# Patient Record
Sex: Male | Born: 1965 | Race: White | Hispanic: No | Marital: Married | Smoking: Current every day smoker
Health system: Southern US, Community
[De-identification: ages and names within clinical notes are randomized; demographics above are authoritative.]

## PROBLEM LIST (undated history)

## (undated) DIAGNOSIS — K219 Gastro-esophageal reflux disease without esophagitis: Secondary | ICD-10-CM

## (undated) DIAGNOSIS — J449 Chronic obstructive pulmonary disease, unspecified: Secondary | ICD-10-CM

## (undated) DIAGNOSIS — K602 Anal fissure, unspecified: Secondary | ICD-10-CM

## (undated) DIAGNOSIS — C801 Malignant (primary) neoplasm, unspecified: Secondary | ICD-10-CM

## (undated) HISTORY — DX: Gastro-esophageal reflux disease without esophagitis: K21.9

## (undated) HISTORY — DX: Anal fissure, unspecified: K60.2

## (undated) HISTORY — PX: CERVICAL DISC SURGERY: SHX588

## (undated) HISTORY — PX: APPENDECTOMY: SHX54

## (undated) HISTORY — PX: OTHER SURGICAL HISTORY: SHX169

## (undated) HISTORY — PX: PLEURAL SCARIFICATION: SHX748

---

## 2002-07-08 ENCOUNTER — Inpatient Hospital Stay (HOSPITAL_COMMUNITY): Admission: EM | Admit: 2002-07-08 | Discharge: 2002-07-10 | Payer: Self-pay | Admitting: Cardiology

## 2007-02-25 ENCOUNTER — Emergency Department (HOSPITAL_COMMUNITY): Admission: EM | Admit: 2007-02-25 | Discharge: 2007-02-25 | Payer: Self-pay | Admitting: Emergency Medicine

## 2009-08-26 ENCOUNTER — Encounter: Admission: RE | Admit: 2009-08-26 | Discharge: 2009-08-26 | Payer: Self-pay | Admitting: Occupational Medicine

## 2009-12-15 ENCOUNTER — Ambulatory Visit (HOSPITAL_COMMUNITY)
Admission: RE | Admit: 2009-12-15 | Discharge: 2009-12-15 | Payer: Self-pay | Source: Home / Self Care | Admitting: Orthopaedic Surgery

## 2010-03-26 DIAGNOSIS — C801 Malignant (primary) neoplasm, unspecified: Secondary | ICD-10-CM

## 2010-03-26 HISTORY — DX: Malignant (primary) neoplasm, unspecified: C80.1

## 2010-08-11 NOTE — Discharge Summary (Signed)
NAME:  Mark Cortez, Mark Cortez                     ACCOUNT NO.:  [id number]   MEDICAL RECORD NO.:  [id number]                   PATIENT TYPE:  INP   LOCATION:  2011                                 FACILITY:  [facility]   PHYSICIAN:  Olga Millers, M.D.                DATE OF BIRTH:  04/06/1965   DATE OF ADMISSION:  07/08/2002  DATE OF DISCHARGE:  07/10/2002                                 DISCHARGE SUMMARY   DISCHARGE DIAGNOSES:  1. Chest discomfort and dyspnea, etiology unclear.  2. Tobacco abuse.  3. History of pneumothorax x2.  4. 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  5. History of spine surgery at the level of C5-6.  6. Previous history of leukocytosis.  7. History of dizziness.     a. MRI of the brain and carotid ultrasound performed by Dr. Doreen Beam.   PROCEDURES:  Cardiac catheterization by Vida Roller, M.D., on 07/09/02:  Normal coronaries.  Normal left ventricle.  Ejection fraction 65%.  No  mitral regurgitation.   HOSPITAL COURSE:  Please see the dictated consultation by Learta Codding,  M.D., on 07/07/02 from [facility] for complete details.  Briefly, this 45 year old male with no known coronary disease and minimal  risk factors for coronary artery disease was transferred to [facility] on 07/08/02 for further workup of chest pain and dyspnea.  His  symptoms were concerning for cardiac ischemia.  At [facility] the patient  underwent cardiac catheterization by Dr. Vida Roller on 07/09/02.  Results  are noted above.  The patient tolerated the procedure well without any  immediate complications.  On the morning of 07/10/02 Dr. Andee Lineman saw the  patient and felt he was stable enough for discharge to home.  Dr. Andee Lineman  suggested scheduling PFTs as an outpatient in [place] with his primary care  physician.  Also, the evaluation for his dizziness is ongoing and this  should be followed up with his primary care physician in  [place].  As noted  above, he has a brain MRI and carotid ultrasounds pending.   LABORATORY DATA:  Total cholesterol 219, triglycerides 151, HDL cholesterol  39, LDL 150.  Sodium 140, potassium 3.9, chloride 106, CO2 28, glucose 115,  BUN 10, creatinine 1.1, calcium 8.9.  Labs done at [facility]:  [facility]  count 8200, hemoglobin 14, hematocrit 43, platelet count 284,000.   Chest x-ray not performed, the patient had a chest x-ray performed at the  beginning of the month with his primary care  physician.   DISCHARGE MEDICATIONS:  1. Metoprolol 25 mg twice daily.  2. Coated aspirin 325 mg daily.  3. Nexium 40 mg daily.   DISCHARGE INSTRUCTIONS:  1. Diet:  No heavy lifting or strenuous activity for two days.  Slowly     advance as tolerated.  2. Wound care:  The patient should call the office for any groin swelling,  bleeding, or bruising.   FOLLOW-UP:  With Dr. Andee Lineman in [place] on 07/23/02 at 1:15 p.m.  The patient  should follow up with Dr. Doreen Beam in [place] as regularly scheduled.  He may  follow up on his brain MRI and ultrasound.  He should also have PFTs  performed.  The patient should practice risk factor modification to include  tobacco cessation and diet control for his dyslipidemia.  If diet does not  control his dyslipidemia, then consideration should then be made for statin  therapy.  This can all be followed up in his primary care physician's  office.       Tereso Newcomer, P.A.                        Olga Millers, M.D.    SW/MEDQ  D:  08/07/2002  T:  08/07/2002  Job:  [id number]   cc:   Learta Codding, M.D.  [address]  [address]  [place]  [place] [address]   Doreen Beam  [address]  [place]  [place] [address]  Fax: [phone]

## 2010-08-11 NOTE — Cardiovascular Report (Signed)
NAME:  Mark Cortez, Mark Cortez                     ACCOUNT NO.:  [id number]   MEDICAL RECORD NO.:  [id number]                   PATIENT TYPE:  INP   LOCATION:  2011                                 FACILITY:  [facility]   PHYSICIAN:  Vida Roller, M.D.                DATE OF BIRTH:  Aug 24, 1965   DATE OF PROCEDURE:  07/09/2002  DATE OF DISCHARGE:                              CARDIAC CATHETERIZATION   REFERRING PHYSICIAN:  Doreen Beam, M.D.   HISTORY OF PRESENT ILLNESS:  This is a 45 year old gentleman with no known  coronary risk factors. He presented with atypical chest discomfort which was  unrelieved by standard maneuvers and chose risk stratification with a left  heart catheterization.   DESCRIPTION OF PROCEDURE:  After obtaining informed consent, the patient was  brought to the cardiology catheterization laboratory where he was prepped  and draped in the usual sterile manner. Local anesthetic was obtained over  the right groin using 1% lidocaine without epinephrine, and the right  femoral artery was cannulated using a modified Seldinger technique with a 6-  French 10-cm sheath. Left heart catheterization was performed using a 6-  French Judkins left #4 and a 6-French no-torquing right catheter. A 6-French  angled pigtail catheter was used for the left ventriculogram which imaged in  the 30 degree RAO view. At the conclusion of the procedure, the catheters  were removed. The patient was moved back to the cardiac observation unit  where the groin sheath was pulled. Hemostasis was obtained using direct  manual pressure; at the conclusion of the hold, there was no evidence of  ecchymosis or hematoma formation. Distal pulses were intact. Total  fluoroscopic time was 4.4 minutes. Total iodinized contrast was 110 cc of  Ultravist.   RESULTS:  Left ventriculogram reveals left ventricular function with no  mitral regurgitation and no wall motion abnormalities.   Left ventriculography reveals  left main coronary artery is a large artery  and is angiographically unremarkable.   Left anterior descending coronary artery is a large artery that bifurcates  along the anterior lateral wall, has two small diagonals, and is  angiographically unremarkable.   The left circumflex coronary artery is a large dominant vessel with a  moderate sized posterior descending coronary artery. There are four obtuse  marginal arteries and two posterior lateral branches. One of the obtuse  marginals, the third obtuse marginal, was a large bifurcating vessel. There  was no disease in the circumflex distribution.   The right coronary artery is a small nondominant vessel which is free of  disease.   ASSESSMENT:  This is a gentleman with noncardiac chest discomfort.  Recommendations are risk factor modification.  Vida Roller, M.D.    JH/MEDQ  D:  07/09/2002  T:  07/10/2002  Job:  [id number]

## 2010-10-11 ENCOUNTER — Encounter (INDEPENDENT_AMBULATORY_CARE_PROVIDER_SITE_OTHER): Payer: Self-pay | Admitting: General Surgery

## 2010-11-10 ENCOUNTER — Encounter (INDEPENDENT_AMBULATORY_CARE_PROVIDER_SITE_OTHER): Payer: Self-pay | Admitting: General Surgery

## 2010-11-15 ENCOUNTER — Encounter (INDEPENDENT_AMBULATORY_CARE_PROVIDER_SITE_OTHER): Payer: Self-pay | Admitting: General Surgery

## 2010-12-08 ENCOUNTER — Encounter (INDEPENDENT_AMBULATORY_CARE_PROVIDER_SITE_OTHER): Payer: Self-pay | Admitting: General Surgery

## 2011-01-01 LAB — CBC
HCT: 45.8
MCHC: 33.3
MCV: 90.3
Platelets: 245
RBC: 5.07
RDW: 13.3
WBC: 13.1 — ABNORMAL HIGH

## 2011-01-01 LAB — BASIC METABOLIC PANEL
Calcium: 8.3 — ABNORMAL LOW
Chloride: 105
GFR calc Af Amer: >60
GFR calc non Af Amer: 57 — ABNORMAL LOW
Potassium: 3.9
Sodium: 138

## 2011-01-01 LAB — DIFFERENTIAL
Lymphocytes Relative: 13
Lymphs Abs: 1.7
Neutro Abs: 11.1 — ABNORMAL HIGH

## 2011-06-15 ENCOUNTER — Ambulatory Visit (INDEPENDENT_AMBULATORY_CARE_PROVIDER_SITE_OTHER): Payer: BC Managed Care – PPO | Admitting: Urology

## 2011-06-15 DIAGNOSIS — L723 Sebaceous cyst: Secondary | ICD-10-CM

## 2011-07-13 ENCOUNTER — Ambulatory Visit (INDEPENDENT_AMBULATORY_CARE_PROVIDER_SITE_OTHER): Payer: BC Managed Care – PPO | Admitting: Urology

## 2011-07-13 DIAGNOSIS — L723 Sebaceous cyst: Secondary | ICD-10-CM

## 2011-09-14 ENCOUNTER — Ambulatory Visit (INDEPENDENT_AMBULATORY_CARE_PROVIDER_SITE_OTHER): Payer: BC Managed Care – PPO | Admitting: Urology

## 2011-09-14 DIAGNOSIS — L723 Sebaceous cyst: Secondary | ICD-10-CM

## 2012-08-07 ENCOUNTER — Other Ambulatory Visit: Payer: Self-pay | Admitting: Occupational Medicine

## 2012-08-07 ENCOUNTER — Ambulatory Visit: Payer: Self-pay

## 2012-08-07 DIAGNOSIS — R52 Pain, unspecified: Secondary | ICD-10-CM

## 2012-08-11 ENCOUNTER — Emergency Department (HOSPITAL_COMMUNITY)
Admission: EM | Admit: 2012-08-11 | Discharge: 2012-08-11 | Disposition: A | Discharge status: Home / Self Care | Payer: Worker's Compensation | Attending: Emergency Medicine | Admitting: Emergency Medicine

## 2012-08-11 ENCOUNTER — Encounter (HOSPITAL_COMMUNITY): Payer: Self-pay | Admitting: *Deleted

## 2012-08-11 ENCOUNTER — Emergency Department (HOSPITAL_COMMUNITY): Payer: Worker's Compensation

## 2012-08-11 DIAGNOSIS — Y9389 Activity, other specified: Secondary | ICD-10-CM | POA: Insufficient documentation

## 2012-08-11 DIAGNOSIS — Y929 Unspecified place or not applicable: Secondary | ICD-10-CM | POA: Insufficient documentation

## 2012-08-11 DIAGNOSIS — S2239XD Fracture of one rib, unspecified side, subsequent encounter for fracture with routine healing: Secondary | ICD-10-CM

## 2012-08-11 DIAGNOSIS — R109 Unspecified abdominal pain: Secondary | ICD-10-CM | POA: Insufficient documentation

## 2012-08-11 DIAGNOSIS — F17200 Nicotine dependence, unspecified, uncomplicated: Secondary | ICD-10-CM | POA: Insufficient documentation

## 2012-08-11 DIAGNOSIS — S2239XA Fracture of one rib, unspecified side, initial encounter for closed fracture: Secondary | ICD-10-CM | POA: Insufficient documentation

## 2012-08-11 DIAGNOSIS — Z8719 Personal history of other diseases of the digestive system: Secondary | ICD-10-CM | POA: Insufficient documentation

## 2012-08-11 DIAGNOSIS — J449 Chronic obstructive pulmonary disease, unspecified: Secondary | ICD-10-CM | POA: Insufficient documentation

## 2012-08-11 DIAGNOSIS — W1809XA Striking against other object with subsequent fall, initial encounter: Secondary | ICD-10-CM | POA: Insufficient documentation

## 2012-08-11 DIAGNOSIS — Z8679 Personal history of other diseases of the circulatory system: Secondary | ICD-10-CM | POA: Insufficient documentation

## 2012-08-11 DIAGNOSIS — Z79899 Other long term (current) drug therapy: Secondary | ICD-10-CM | POA: Insufficient documentation

## 2012-08-11 DIAGNOSIS — J4489 Other specified chronic obstructive pulmonary disease: Secondary | ICD-10-CM | POA: Insufficient documentation

## 2012-08-11 HISTORY — DX: Chronic obstructive pulmonary disease, unspecified: J44.9

## 2012-08-11 HISTORY — DX: Malignant (primary) neoplasm, unspecified: C80.1

## 2012-08-11 MED ORDER — IOHEXOL 300 MG/ML  SOLN
100.0000 mL | Freq: Once | INTRAMUSCULAR | Status: AC | PRN
  Administered 2012-08-11: 100 mL via INTRAVENOUS

## 2012-08-11 MED ORDER — HYDROMORPHONE HCL PF 1 MG/ML IJ SOLN
1.0000 mg | Freq: Once | INTRAMUSCULAR | Status: AC
  Administered 2012-08-11: 1 mg via INTRAVENOUS
  Filled 2012-08-11: qty 1

## 2012-08-11 MED ORDER — HYDROMORPHONE HCL PF 1 MG/ML IJ SOLN: 1.0000 mg | Freq: Once | INTRAMUSCULAR | Status: DC

## 2012-08-11 MED ORDER — OXYCODONE-ACETAMINOPHEN 5-325 MG PO TABS: 1.0000 | ORAL_TABLET | Freq: Four times a day (QID) | ORAL | Status: DC | PRN

## 2012-08-11 NOTE — ED Notes (Signed)
MD at bedside. 

## 2012-08-11 NOTE — ED Notes (Signed)
Fell on 5/15, Seen at Urgent care in [place], [place] to have pain. Lt ribs.  Has Rib belt on

## 2012-08-11 NOTE — ED Notes (Addendum)
Pt fell Thursday and landed on some pallets on left side of ribs, was seen at Urgent Care, work note stated for pt not to reach overhead but due to pt's job, pt has to reach overhead and bend over, pt states that he worked on Friday and today, pain not any better, pain with deep breaths and cough, bruising noted to left rib area

## 2012-08-11 NOTE — ED Notes (Signed)
Pt transported out of dept via wheelchair after d/c instructions reviewed and pt and wife's questions were answered, no further questions and verbalized understanding of follow-up

## 2012-08-11 NOTE — ED Provider Notes (Signed)
History    This chart was scribed for Mark Lennert, MD by Marlyne Beards, ED Scribe. The patient was seen in room APA03/APA03. Patient's care was started at 9:11 PM.    CSN: [id number]  Arrival date & time 08/11/12  1854   First MD Initiated Contact with Patient 08/11/12 2111      Chief Complaint  Patient presents with  . Fall    (Consider location/radiation/quality/duration/timing/severity/associated sxs/prior treatment) Patient is a 47 y.o. male presenting with fall. The history is provided by the patient. No language interpreter was used.  Fall There was no blood loss. The pain is moderate. He was ambulatory at the scene. There was no entrapment after the fall. There was no drug use involved in the accident. There was no alcohol use involved in the accident. Pertinent negatives include no abdominal pain, no hematuria and no headaches.   HPI Comments: Mark Cortez is a 47 y.o. male who presents to the Emergency Department complaining of moderate constant pain in his left ribs which occurred 4 days ago (5/15). Pt states that he fell on a pallet last Thursday injuring his left rib region. Pt states that movement exacerbates the pain as well as upon palpation in his left side. Pt was seen at an Urgent Care in [place] for sx's but has continued to experience pain. Pt denies any LOC,HI, fever, chills, cough, nausea, vomiting, diarrhea, SOB, weakness, and any other associated symptoms.   Past Medical History  Diagnosis Date  . Asthma   . GERD (gastroesophageal reflux disease)   . Hemorrhoids   . Anal fissure   . COPD (chronic obstructive pulmonary disease)     Past Surgical History  Procedure Laterality Date  . Appendectomy    . Cervical disc surgery    . Knee sugery    . Pleural scarification      Family History  Problem Relation Age of Onset  . Cancer Father     lung    History  Substance Use Topics  . Smoking status: Current Every Day Smoker -- 1.50 packs/day  for 20 years    Types: Cigarettes  . Smokeless tobacco: Not on file  . Alcohol Use: No      Review of Systems  Constitutional: Negative for appetite change and fatigue.  HENT: Negative for congestion, sinus pressure and ear discharge.   Eyes: Negative for discharge.  Respiratory: Negative for cough.   Cardiovascular: Positive for chest pain.  Gastrointestinal: Negative for abdominal pain and diarrhea.  Genitourinary: Positive for flank pain. Negative for frequency and hematuria.  Musculoskeletal: Negative for back pain.  Skin: Negative for rash.  Neurological: Negative for seizures and headaches.  Psychiatric/Behavioral: Negative for hallucinations.    Allergies  Penicillins  Home Medications   Current Outpatient Rx  Name  Route  Sig  Dispense  Refill  . ALPRAZolam (XANAX) 1 MG tablet   Oral   Take 1 mg by mouth daily.           Marland Kitchen HYDROcodone-acetaminophen (LORTAB) 10-500 MG per tablet   Oral   Take 1 tablet by mouth 2 (two) times daily as needed.             BP 124/65  Pulse 103  Temp(Src) 97.5 F (36.4 C) (Oral)  Resp 20  Ht 5\' 10"  (1.778 m)  Wt 210 lb (95.255 kg)  BMI 30.13 kg/m2  SpO2 99%  Physical Exam  Nursing note and vitals reviewed. Constitutional: He is oriented to person,  place, and time. He appears well-developed.  HENT:  Head: Normocephalic.  Eyes: Conjunctivae and EOM are normal. No scleral icterus.  Neck: Neck supple. No thyromegaly present.  Cardiovascular: Normal rate and regular rhythm.  Exam reveals no gallop and no friction rub.   No murmur heard. Pulmonary/Chest: No stridor. He has no wheezes. He has no rales. He exhibits no tenderness.  Abdominal: He exhibits no distension. There is tenderness. There is no rebound.  Tenderness in left flank and left lateral ribs.   Musculoskeletal: He exhibits tenderness. He exhibits no edema.  Lymphadenopathy:    He has no cervical adenopathy.  Neurological: He is oriented to person, place,  and time. Coordination normal.  Skin: No rash noted. No erythema.  Psychiatric: He has a normal mood and affect. His behavior is normal.    ED Course  Procedures (including critical care time) DIAGNOSTIC STUDIES: Oxygen Saturation is 99% on room air, normal by my interpretation.    COORDINATION OF CARE: 9:19 PM Discussed ED treatment with pt and pt agrees.  10:46 PM Discussed with pt that x-rays showed a cracked rib. Pt states that he has been taking hydrocodone for past neck surgery with no immediate relief. Writing note to keep pt out of work for 3 days.   Labs Reviewed - No data to display Ct Abdomen Pelvis W Contrast  08/11/2012   *RADIOLOGY REPORT*  Clinical Data: Larey Seat.  Abdominal pain.  CT ABDOMEN AND PELVIS WITH CONTRAST  Technique:  Multidetector CT imaging of the abdomen and pelvis was performed following the standard protocol during bolus administration of intravenous contrast.  Contrast: OMNIPAQUE IOHEXOL 300 MG/ML  SOLN  Comparison: None.  Findings: The lung bases are clear.  No pneumothorax, pulmonary contusion or pleural effusion.  There is a nondisplaced fracture involving the left ninth rib.  The solid abdominal organs are intact.  No acute injury or mass. The gallbladder is normal.  No common bile duct dilatation.  The stomach, duodenum, small bowel and colon are grossly normal without oral contrast.  The aorta is normal in caliber.  Minimal scattered atherosclerotic calcifications.  No focal aneurysm or dissection.  The major branch vessels are patent.  No mesenteric or retroperitoneal mass, adenopathy or hematoma.  The bladder, prostate gland seminal vesicles are unremarkable.  No pelvic mass, adenopathy or free pelvic fluid collections.  No pelvic hematoma.  The bony pelvis is intact.  The lumbar vertebral bodies are normally aligned.  No acute fracture.  IMPRESSION:  1.  Left 9th anterolateral rib fracture. 2.  No other significant bony findings. 3.  No acute  abdominal/pelvic findings.   Original Report Authenticated By: Rudie Meyer, M.D.     No diagnosis found.    MDM      The chart was scribed for me under my direct supervision.  I personally performed the history, physical, and medical decision making and all procedures in the evaluation of this patient.Mark Lennert, MD 08/11/12 2252

## 2012-08-14 ENCOUNTER — Ambulatory Visit: Payer: Worker's Compensation

## 2012-08-14 ENCOUNTER — Other Ambulatory Visit: Payer: Self-pay | Admitting: Occupational Medicine

## 2012-08-14 DIAGNOSIS — R52 Pain, unspecified: Secondary | ICD-10-CM

## 2012-09-17 ENCOUNTER — Encounter: Payer: Self-pay | Admitting: Physical Medicine & Rehabilitation

## 2012-10-20 ENCOUNTER — Ambulatory Visit (HOSPITAL_BASED_OUTPATIENT_CLINIC_OR_DEPARTMENT_OTHER): Payer: Worker's Compensation | Admitting: Physical Medicine & Rehabilitation

## 2012-10-20 ENCOUNTER — Encounter: Payer: Worker's Compensation | Attending: Physical Medicine & Rehabilitation

## 2012-10-20 ENCOUNTER — Encounter: Payer: Self-pay | Admitting: Physical Medicine & Rehabilitation

## 2012-10-20 VITALS — BP 101/73 | HR 101 | Resp 14 | Ht 71.0 in | Wt 204.0 lb

## 2012-10-20 DIAGNOSIS — R209 Unspecified disturbances of skin sensation: Secondary | ICD-10-CM | POA: Insufficient documentation

## 2012-10-20 DIAGNOSIS — G5710 Meralgia paresthetica, unspecified lower limb: Secondary | ICD-10-CM

## 2012-10-20 DIAGNOSIS — Z5181 Encounter for therapeutic drug level monitoring: Secondary | ICD-10-CM

## 2012-10-20 DIAGNOSIS — S20219A Contusion of unspecified front wall of thorax, initial encounter: Secondary | ICD-10-CM | POA: Insufficient documentation

## 2012-10-20 DIAGNOSIS — Y9269 Other specified industrial and construction area as the place of occurrence of the external cause: Secondary | ICD-10-CM | POA: Insufficient documentation

## 2012-10-20 DIAGNOSIS — W1809XA Striking against other object with subsequent fall, initial encounter: Secondary | ICD-10-CM | POA: Insufficient documentation

## 2012-10-20 DIAGNOSIS — M542 Cervicalgia: Secondary | ICD-10-CM | POA: Insufficient documentation

## 2012-10-20 DIAGNOSIS — S336XXA Sprain of sacroiliac joint, initial encounter: Secondary | ICD-10-CM | POA: Insufficient documentation

## 2012-10-20 DIAGNOSIS — M5412 Radiculopathy, cervical region: Secondary | ICD-10-CM | POA: Insufficient documentation

## 2012-10-20 DIAGNOSIS — G5712 Meralgia paresthetica, left lower limb: Secondary | ICD-10-CM | POA: Insufficient documentation

## 2012-10-20 DIAGNOSIS — M25561 Pain in right knee: Secondary | ICD-10-CM

## 2012-10-20 DIAGNOSIS — M25569 Pain in unspecified knee: Secondary | ICD-10-CM

## 2012-10-20 DIAGNOSIS — M549 Dorsalgia, unspecified: Secondary | ICD-10-CM | POA: Insufficient documentation

## 2012-10-20 MED ORDER — CYCLOBENZAPRINE HCL 10 MG PO TABS: 10.0000 mg | ORAL_TABLET | Freq: Every day | ORAL | Status: DC

## 2012-10-20 MED ORDER — GABAPENTIN 100 MG PO CAPS: 100.0000 mg | ORAL_CAPSULE | Freq: Three times a day (TID) | ORAL | Status: DC

## 2012-10-20 MED ORDER — LIDOCAINE 5 % EX PTCH: 1.0000 | MEDICATED_PATCH | CUTANEOUS | Status: DC

## 2012-10-20 NOTE — Patient Instructions (Signed)
Order cervical MRI Return to clinic 2-3 weeks to review Lidoderm patch to the left ribs on 12 hr CycloBenzaprine at night 10 mg Gabapentin for burning pain 100 mg 3 times per

## 2012-10-20 NOTE — Progress Notes (Signed)
Subjective:    Patient ID: Mark Cortez, male    DOB: 07/16/1965, 47 y.o.   MRN: [id number]  HPI Date of injury 08/07/2012 Neck and his of injury fall onto pallets Fall occurred at work. Evaluated at [facility] 08/11/2012. CT of the abdomen and pelvis revealed left ninth anterior lateral rib fracture. He was given Percocet. Pain radiated to the back. [facility]up at[facility]. 09/04/2012. No change in pain. Examination showed no neurologic deficits.Was taking hydrocodone at time of injury. Currently complaining of more widespread pain including pain in the neck and left upper extremity. Difficulty holding objects with both hands. Also some burning pain in left anterior thigh. Treatment at occupational medicine was ibuprofen 800 mg 3 times a day and tramadol at night. He was placed on limited duty due to 5lb restriction Pain diaphragm reviewed. Marks on left side of neck shoulder thoracic and lumbar spine as well as left elbow and left fingers. Also burning left anterior thigh.  PMH:  ACDF with radiating R arm pain, 12 yrs ago Dr Danielle Dess, R rotator cuff injury, chronic hydrocodone for musculoskeletal pain  Pain Inventory Average Pain 9 Pain Right Now 8 My pain is constant, sharp, burning, dull, stabbing, tingling and aching  In the last 24 hours, has pain interfered with the following? General activity 7 Relation with others 7 Enjoyment of life 8 What TIME of day is your pain at its worst? evening and night Sleep (in general) Poor  Pain is worse with: walking, bending, sitting, inactivity, standing and some activites Pain improves with: medication Relief from Meds: 2  Mobility walk without assistance do you drive?  yes Do you have any goals in this area?  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  no  Physicians involved in your care Any changes since last visit?  no   Family History  Problem Relation Age of Onset  . Cancer Father     lung   History   Social History  . Marital Status: Married    Spouse Name: N/A    Number of Children: N/A  . Years of Education: N/A   Social History Main Topics  . Smoking status: Current Every Day Smoker -- 1.50 packs/day for 20 years    Types: Cigarettes  . Smokeless tobacco: None  . Alcohol Use: No  . Drug Use: No  . Sexually Active: None   Other Topics Concern  . None   Social History Narrative  . None   Past Surgical History  Procedure Laterality Date  . Appendectomy    . Cervical disc surgery    . Knee sugery    . Pleural scarification     Past Medical History  Diagnosis Date  . Asthma   . GERD (gastroesophageal reflux disease)   . Hemorrhoids   . Anal fissure   . COPD (chronic obstructive pulmonary disease)   . Cancer 2012    prostate- radiation treatments per pt.   BP 101/73  Pulse 101  Resp 14  Ht 5\' 11"  (1.803 m)  Wt 204 lb (92.534 kg)  BMI 28.46 kg/m2  SpO2 98%   Review of Systems  HENT: Positive for neck pain.        Arm pain  Musculoskeletal: Positive for back  pain and gait problem.       Spasms  Neurological: Positive for weakness and numbness.       Tingling  Psychiatric/Behavioral: Positive for dysphoric mood. The patient is nervous/anxious.   All other systems reviewed and are negative.       Objective:   Physical Exam  Constitutional: He is oriented to person, place, and time. He appears listless.  Musculoskeletal:       Right elbow: Normal.      Left elbow: Normal.       Right hip: Normal.       Left hip: Normal.       Right knee: Normal.       Left knee: Normal.       Right ankle: Normal.       Left ankle: Normal.       Cervical  back: He exhibits decreased range of motion. He exhibits no tenderness and no deformity.       Thoracic back: Normal.       Lumbar back: He exhibits decreased range of motion, tenderness and pain. He exhibits no deformity and no spasm.  Left lower rib tenderness to palpation Positive Fabers pain referring to sacroiliac .Bi lateral  Neurological: He is oriented to person, place, and time. He has normal strength. He appears listless. He displays no atrophy. A sensory deficit is present. He exhibits normal muscle tone. Gait abnormal. Coordination normal.  Reflex Scores:      Tricep reflexes are 1+ on the right side and 1+ on the left side.      Bicep reflexes are 2+ on the right side and 1+ on the left side.      Brachioradialis reflexes are 2+ on the right side and 2+ on the left side.      Patellar reflexes are 2+ on the right side and 2+ on the left side.      Achilles reflexes are 2+ on the right side and 2+ on the left side. Decreased sensation in bilateral C7,8 dermatomes Decreased sensation left lateral thigh           Assessment & Plan:  #1. Left rib contusion with Left 9th rib fx , no other interventions needed will trial lidoderm patch #2.  Left Neck pain and pain radiating to Left arm, reduced sensation in bilateral C7 and see dermatomes. Also has reduced tricep reflexes bilateral. And decreased left bicep reflex. For this reason will check an MRI of the cervical spine without contrast 3. Left lateral thigh numbness. This is most likely secondary to a lateral femoral cutaneous nerve compression related to the fall. May benefit from gabapentin for this. Hold off on physical therapy until testing is completed. Return to clinic in 2-3 weeks 4. Back pain with exam findings consistent with sacroiliac sprain Continue 5 pound lifting restriction Discussed with case manager and pt FAX [phone]

## 2012-10-21 ENCOUNTER — Telehealth: Payer: Self-pay | Admitting: Physical Medicine & Rehabilitation

## 2012-10-21 NOTE — Telephone Encounter (Signed)
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 Called [phone] or fax info to [phone]

## 2012-10-21 NOTE — Telephone Encounter (Signed)
Left message with Pam. Allow patient to lift up to 5 pounds. 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

## 2012-10-22 NOTE — Telephone Encounter (Signed)
Letter faxed to number [facility] CM, requested.

## 2012-10-24 ENCOUNTER — Telehealth: Payer: Self-pay | Admitting: *Deleted

## 2012-10-24 NOTE — Telephone Encounter (Signed)
Left message for patient to call office regarding his call.

## 2012-10-24 NOTE — Telephone Encounter (Signed)
Call from patient but no message left

## 2012-11-03 ENCOUNTER — Telehealth: Payer: Self-pay

## 2012-11-03 NOTE — Telephone Encounter (Signed)
[facility] called to get demographic information for patient to schedule MRI.  Number given.

## 2012-11-13 ENCOUNTER — Encounter: Payer: Self-pay | Admitting: Physical Medicine & Rehabilitation

## 2012-11-13 ENCOUNTER — Ambulatory Visit (HOSPITAL_BASED_OUTPATIENT_CLINIC_OR_DEPARTMENT_OTHER): Payer: Worker's Compensation | Admitting: Physical Medicine & Rehabilitation

## 2012-11-13 ENCOUNTER — Encounter: Payer: Worker's Compensation | Attending: Physical Medicine & Rehabilitation

## 2012-11-13 VITALS — BP 119/69 | HR 101 | Resp 14 | Ht 70.0 in | Wt 204.6 lb

## 2012-11-13 DIAGNOSIS — S161XXD Strain of muscle, fascia and tendon at neck level, subsequent encounter: Secondary | ICD-10-CM

## 2012-11-13 DIAGNOSIS — W1809XA Striking against other object with subsequent fall, initial encounter: Secondary | ICD-10-CM | POA: Insufficient documentation

## 2012-11-13 DIAGNOSIS — M549 Dorsalgia, unspecified: Secondary | ICD-10-CM | POA: Insufficient documentation

## 2012-11-13 DIAGNOSIS — M47812 Spondylosis without myelopathy or radiculopathy, cervical region: Secondary | ICD-10-CM

## 2012-11-13 DIAGNOSIS — R209 Unspecified disturbances of skin sensation: Secondary | ICD-10-CM | POA: Insufficient documentation

## 2012-11-13 DIAGNOSIS — Y9269 Other specified industrial and construction area as the place of occurrence of the external cause: Secondary | ICD-10-CM | POA: Insufficient documentation

## 2012-11-13 DIAGNOSIS — S20219A Contusion of unspecified front wall of thorax, initial encounter: Secondary | ICD-10-CM | POA: Insufficient documentation

## 2012-11-13 DIAGNOSIS — S336XXD Sprain of sacroiliac joint, subsequent encounter: Secondary | ICD-10-CM

## 2012-11-13 DIAGNOSIS — Z5189 Encounter for other specified aftercare: Secondary | ICD-10-CM

## 2012-11-13 DIAGNOSIS — M542 Cervicalgia: Secondary | ICD-10-CM | POA: Insufficient documentation

## 2012-11-13 DIAGNOSIS — M961 Postlaminectomy syndrome, not elsewhere classified: Secondary | ICD-10-CM

## 2012-11-13 MED ORDER — TRAZODONE HCL 50 MG PO TABS: 50.0000 mg | ORAL_TABLET | Freq: Every day | ORAL | Status: DC

## 2012-11-13 MED ORDER — TRAMADOL HCL 50 MG PO TABS: 50.0000 mg | ORAL_TABLET | Freq: Three times a day (TID) | ORAL | Status: DC | PRN

## 2012-11-13 NOTE — Progress Notes (Addendum)
Subjective:    Patient ID: Mark Cortez, male    DOB: 1965-05-05, 47 y.o.   MRN: [id number]  HPI HPI  Date of injury 08/07/2012  Neck and his of injury fall onto pallets  Fall occurred at work.  Evaluated at [facility] 08/11/2012. CT of the abdomen and pelvis revealed left ninth anterior lateral rib fracture. He was given Percocet. Pain radiated to the back.  Followup at: [facility]. 09/04/2012. No change in pain. Examination showed no neurologic deficits.Was taking hydrocodone at time of injury.  Currently complaining of more widespread pain including pain in the neck and left upper extremity. Difficulty holding objects with both hands. Also some burning pain in left anterior thigh.  Treatment at occupational medicine was ibuprofen 800 mg 3 times a day and tramadol at night. He was placed on limited duty due to 5lb restriction  Pain diaphragm reviewed. Marks on left side of neck shoulder thoracic and lumbar spine as well as left elbow and left fingers. Also burning left anterior thigh.  PMH: ACDF with radiating R arm pain, 12 yrs ago Dr Danielle Dess, R rotator cuff injury, chronic hydrocodone for musculoskeletal pain  Pain Inventory Average Pain 10 Pain Right Now 10 My pain is constant, sharp, burning and aching  In the last 24 hours, has pain interfered with the following? General activity 10 Relation with others 10 Enjoyment of life 10 What TIME of day is your pain at its worst? all Sleep (in general) Poor  Pain is worse with: walking, bending, sitting, inactivity and standing Pain improves with: nothing Relief from Meds: 1  Mobility walk without assistance  Function not employed: date last employed 08/07/12 I need assistance with the following:  household duties and shopping  Neuro/Psych numbness trouble walking  Prior Studies CT/MRI  Physicians involved in your care Any changes since last visit?  no   Family History  Problem Relation Age  of Onset  . Cancer Father     lung   History   Social History  . Marital Status: Married    Spouse Name: N/A    Number of Children: N/A  . Years of Education: N/A   Social History Main Topics  . Smoking status: Current Every Day Smoker -- 1.50 packs/day for 20 years    Types: Cigarettes  . Smokeless tobacco: Never Used  . Alcohol Use: No  . Drug Use: No  . Sexual Activity: None   Other Topics Concern  . None   Social History Narrative  . None   Past Surgical History  Procedure Laterality Date  . Appendectomy    . Cervical disc surgery    . Knee sugery    . Pleural scarification     Past Medical History  Diagnosis Date  . Asthma   . GERD (gastroesophageal reflux disease)   . Hemorrhoids   . Anal fissure   . COPD (chronic obstructive pulmonary disease)   . Cancer 2012    prostate- radiation treatments per pt.   BP 119/69  Pulse 101  Resp 14  Ht 5\' 10"  (1.778 m)  Wt 204 lb 9.6 oz (92.806 kg)  BMI 29.36 kg/m2  SpO2 99%   Review of Systems  Respiratory: Positive for cough, shortness of breath and wheezing.   Musculoskeletal: Positive for gait problem.  Neurological: Positive for numbness.  All other systems reviewed and are negative.       Objective:   Physical Exam  Assessment & Plan:  #1. Left rib contusion with Left 9th rib fx , no other interventions needed will trial lidoderm patch  #2. Left Neck pain and pain radiating to Left arm, reduced sensation in bilateral C7  dermatomes. Also has reduced tricep reflexes bilateral. And decreased left bicep reflex. I do not see a significant correlation with his most recent MRI which did not demonstrate any significant central or foraminal stenosis. Therefore I do not think the MRI explains the patient's finger numbness. I reviewed the MRI the cervical spine no significant findings at C2-C3 C3-C4 mild disc bulges C4-C5. There is a small step off between C4 and C5. Difficult to say whether this was  uncovertebral spurring versus a mild spondylolisthesis. I would like Dr. Danielle Dess who did his previous C5-C6 surgery to evaluate. Will hold off on therapy and return to work until evaluation. I spoke with Dr. Verlee Rossetti office he is off this week.  It may suffice just to have Dr. Danielle Dess view the films if he cannot see the patient in the next week or 2.  I reviewed worked description sent by his attorney. This does not look like a strenuous job. From the lumbar spine standpoint I don't think he should have any difficulties. See above Not released to work yet. 3. Left lateral thigh numbness. This is most likely secondary to a lateral femoral cutaneous nerve compression related to the fall. May benefit from gabapentin for this.  Hold off on physical therapy until testing is completed.  Return to clinic in 2-3 weeks  4. Back pain with exam findings consistent with sacroiliac sprain   Discussed with case manager Mal Misty RN and pt FAX [phone] [address] [place] [address]

## 2012-11-13 NOTE — Patient Instructions (Signed)
No work until eval by Dr Danielle Dess to see if there is a spur or a step off between C4 and C5 Schedule fr Lumbar facet injection after Dr Danielle Dess eval Trial of Tramadol 50mg   3 times a day Trial of Trazodone for sleep RTC 3 weeks

## 2012-12-05 ENCOUNTER — Ambulatory Visit (HOSPITAL_BASED_OUTPATIENT_CLINIC_OR_DEPARTMENT_OTHER): Payer: Worker's Compensation | Admitting: Physical Medicine & Rehabilitation

## 2012-12-05 ENCOUNTER — Encounter: Payer: Worker's Compensation | Attending: Physical Medicine & Rehabilitation

## 2012-12-05 ENCOUNTER — Encounter: Payer: Self-pay | Admitting: Physical Medicine & Rehabilitation

## 2012-12-05 VITALS — BP 112/66 | HR 86 | Resp 16 | Ht 70.0 in | Wt 204.0 lb

## 2012-12-05 DIAGNOSIS — S301XXA Contusion of abdominal wall, initial encounter: Secondary | ICD-10-CM | POA: Insufficient documentation

## 2012-12-05 DIAGNOSIS — Z5189 Encounter for other specified aftercare: Secondary | ICD-10-CM

## 2012-12-05 DIAGNOSIS — M961 Postlaminectomy syndrome, not elsewhere classified: Secondary | ICD-10-CM

## 2012-12-05 DIAGNOSIS — IMO0001 Reserved for inherently not codable concepts without codable children: Secondary | ICD-10-CM

## 2012-12-05 DIAGNOSIS — M79609 Pain in unspecified limb: Secondary | ICD-10-CM | POA: Insufficient documentation

## 2012-12-05 DIAGNOSIS — Y990 Civilian activity done for income or pay: Secondary | ICD-10-CM | POA: Insufficient documentation

## 2012-12-05 DIAGNOSIS — M549 Dorsalgia, unspecified: Secondary | ICD-10-CM | POA: Insufficient documentation

## 2012-12-05 DIAGNOSIS — R209 Unspecified disturbances of skin sensation: Secondary | ICD-10-CM | POA: Insufficient documentation

## 2012-12-05 DIAGNOSIS — S336XXD Sprain of sacroiliac joint, subsequent encounter: Secondary | ICD-10-CM

## 2012-12-05 DIAGNOSIS — M542 Cervicalgia: Secondary | ICD-10-CM | POA: Insufficient documentation

## 2012-12-05 DIAGNOSIS — R296 Repeated falls: Secondary | ICD-10-CM | POA: Insufficient documentation

## 2012-12-05 MED ORDER — TRAMADOL HCL 50 MG PO TABS: 50.0000 mg | ORAL_TABLET | Freq: Four times a day (QID) | ORAL | Status: DC | PRN

## 2012-12-05 NOTE — Progress Notes (Signed)
Subjective:    Patient ID: CASEY FYE, male    DOB: 10/12/1965, 47 y.o.   MRN: [id number] HPI  Date of injury 08/07/2012  Neck and his of injury fall onto pallets  Fall occurred at work.  Evaluated at [facility] 08/11/2012. CT of the abdomen and pelvis revealed left ninth anterior lateral rib fracture. He was given Percocet. Pain radiated to the back.  Followup at: [facility]. 09/04/2012. No change in pain. Examination showed no neurologic deficits.Was taking hydrocodone at time of injury.  Currently complaining of more widespread pain including pain in the neck and left upper extremity. Difficulty holding objects with both hands. Also some burning pain in left anterior thigh.  Treatment at occupational medicine was ibuprofen 800 mg 3 times a day and tramadol at night. He was placed on limited duty due to 5lb restriction  Pain diaphragm reviewed. Marks on left side of neck shoulder thoracic and lumbar spine as well as left elbow and left fingers. Also burning left anterior thigh.  PMH: ACDF with radiating R arm pain, 12 yrs ago Dr Danielle Dess, R rotator cuff injury, chronic hydrocodone for musculoskeletal pain  HPI I reviewed the radiology report. The small step off between C4 and C5 is secondary to spurring. Dr. Danielle Dess reviewed the MRI and did not feel that he needed to be involved with this patient. There was additional disc degeneration at C6-C7 compared to 12 years ago. I discussed this with the patient that this is normal after spine surgery with fusion to have additional degeneration at a level above or below the fusion.  Still having pain primarily low back as primary complaint. Not much issue with left lateral thigh numbness at the current time. Left rib pain is only when he lays on that side Pain Inventory Average Pain 8 Pain Right Now 8 My pain is constant and sharp  In the last 24 hours, has pain interfered with the following? General activity 8 Relation  with others 7 Enjoyment of life 7 What TIME of day is your pain at its worst? constant Sleep (in general) Fair  Pain is worse with: some activites Pain improves with: rest and medication Relief from Meds: 3  Mobility walk without assistance how many minutes can you walk? 10 ability to climb steps?  yes do you drive?  yes Do you have any goals in this area?  yes  Function not employed: date last employed workers comp 08/07/12  Neuro/Psych weakness numbness tingling trouble walking confusion depression  Prior Studies Any changes since last visit?  no  Physicians involved in your care Any changes since last visit?  no   Family History  Problem Relation Age of Onset  . Cancer Father     lung   History   Social History  . Marital Status: Married    Spouse Name: N/A    Number of Children: N/A  . Years of Education: N/A   Social History Main Topics  . Smoking status: Current Every Day Smoker -- 1.50 packs/day for 20 years    Types: Cigarettes  . Smokeless tobacco: Never Used  . Alcohol Use: No  . Drug Use: No  . Sexual Activity: None   Other Topics Concern  . None   Social History Narrative  . None   Past Surgical History  Procedure Laterality Date  . Appendectomy    . Cervical disc surgery    . Knee sugery    . Pleural scarification  Past Medical History  Diagnosis Date  . Asthma   . GERD (gastroesophageal reflux disease)   . Hemorrhoids   . Anal fissure   . COPD (chronic obstructive pulmonary disease)   . Cancer 2012    prostate- radiation treatments per pt.   BP 112/66  Pulse 86  Resp 16  Ht 5\' 10"  (1.778 m)  Wt 204 lb (92.534 kg)  BMI 29.27 kg/m2  SpO2 98%     Review of Systems  Musculoskeletal: Positive for back pain and gait problem.  Neurological: Positive for dizziness, weakness and numbness.  Psychiatric/Behavioral: Positive for confusion and dysphoric mood.  All other systems reviewed and are negative.        Objective:   Physical Exam  Nursing note and vitals reviewed. Constitutional: He is oriented to person, place, and time. He appears well-developed and well-nourished.  HENT:  Head: Normocephalic and atraumatic.  Eyes: Conjunctivae and EOM are normal. Pupils are equal, round, and reactive to light.  Neck: Normal range of motion.  Musculoskeletal:       Cervical back: He exhibits decreased range of motion and tenderness.       Lumbar back: He exhibits decreased range of motion and tenderness. He exhibits no deformity and no spasm.  Reduced cervical range of motion particularly looking toward the left side at about 50% of lateral rotation and bending. 75% flexion extension. Negative foraminal compression test, Tenderness over the trapezius muscles bilateral.  Left PSIS tenderness Positive FABERS left SI area  Neurological: He is alert and oriented to person, place, and time. He has normal strength and normal reflexes. He displays no atrophy. No sensory deficit. He exhibits normal muscle tone. Coordination and gait normal.          Assessment & Plan:  #1. Left rib contusion with Left 9th rib fx , no other interventions needed will trial lidoderm patch  #2. Left Neck pain and pain radiating to Left arm, normal sensation in bilateral UEs. I reviewed the MRI the cervical spine no significant findings at C2-C3 C3-C4 mild disc bulges C4-C5. There is a small step off between C4 and C5. Radiologist report indicates uncovertebral spurring which makes sense.  Dr. Danielle Dess reviewed films.  No need for neurosurgery follow up.Will start physcial therapy and return to work light duty.I reviewed worked description sent by his attorney. This does not look like a strenuous job. From the lumbar spine standpoint I don't think he should have any difficulties.  3. Left lateral thigh numbness. This is most likely secondary to a lateral femoral cutaneous nerve compression related to the fall. No complaints after  starting gabapentin for this.  Hold off on physical therapy until testing is completed.  Return to clinic in 2-3 weeks  4. Back pain with exam findings consistent with sacroiliac sprain, rec injection in two weeks.  This is now primary complaint.  Increase tramadol to qid  Discussed with pt  And   with case manager Mal Misty RN and pt FAX [phone]  [address]  [place] [address]

## 2012-12-05 NOTE — Patient Instructions (Signed)
  MMI:_No_  Restrictions: Light duty 20 pound lifting restriction Occasional bending squatting No restriction on walking or standing No restriction on upper Extremity use  Therapy: Start PT  Medications:tramadol 50mg  QID  Injections: Left Sacroiliac  Follow-up visit:2 wks

## 2012-12-22 ENCOUNTER — Encounter: Payer: Worker's Compensation | Attending: Physical Medicine & Rehabilitation

## 2012-12-22 ENCOUNTER — Ambulatory Visit (HOSPITAL_BASED_OUTPATIENT_CLINIC_OR_DEPARTMENT_OTHER): Payer: Worker's Compensation | Admitting: Physical Medicine & Rehabilitation

## 2012-12-22 ENCOUNTER — Encounter: Payer: Self-pay | Admitting: Physical Medicine & Rehabilitation

## 2012-12-22 VITALS — BP 119/73 | HR 75 | Resp 16 | Ht 70.0 in | Wt 202.5 lb

## 2012-12-22 DIAGNOSIS — M5430 Sciatica, unspecified side: Secondary | ICD-10-CM

## 2012-12-22 DIAGNOSIS — M5432 Sciatica, left side: Secondary | ICD-10-CM

## 2012-12-22 DIAGNOSIS — G5710 Meralgia paresthetica, unspecified lower limb: Secondary | ICD-10-CM

## 2012-12-22 DIAGNOSIS — G5712 Meralgia paresthetica, left lower limb: Secondary | ICD-10-CM

## 2012-12-22 DIAGNOSIS — W1809XA Striking against other object with subsequent fall, initial encounter: Secondary | ICD-10-CM | POA: Insufficient documentation

## 2012-12-22 DIAGNOSIS — Z5189 Encounter for other specified aftercare: Secondary | ICD-10-CM

## 2012-12-22 DIAGNOSIS — S2239XA Fracture of one rib, unspecified side, initial encounter for closed fracture: Secondary | ICD-10-CM | POA: Insufficient documentation

## 2012-12-22 DIAGNOSIS — Y990 Civilian activity done for income or pay: Secondary | ICD-10-CM | POA: Insufficient documentation

## 2012-12-22 DIAGNOSIS — S336XXD Sprain of sacroiliac joint, subsequent encounter: Secondary | ICD-10-CM

## 2012-12-22 DIAGNOSIS — M79609 Pain in unspecified limb: Secondary | ICD-10-CM | POA: Insufficient documentation

## 2012-12-22 DIAGNOSIS — M542 Cervicalgia: Secondary | ICD-10-CM | POA: Insufficient documentation

## 2012-12-22 MED ORDER — IBUPROFEN 800 MG PO TABS: 800.0000 mg | ORAL_TABLET | Freq: Three times a day (TID) | ORAL | Status: DC | PRN

## 2012-12-22 MED ORDER — GABAPENTIN 100 MG PO CAPS: 100.0000 mg | ORAL_CAPSULE | Freq: Three times a day (TID) | ORAL | Status: DC

## 2012-12-22 NOTE — Progress Notes (Signed)
Subjective:    Patient ID: Mark Cortez, male    DOB: 08/21/1965, 47 y.o.   MRN: [id number]  HPI HPI  Date of injury 08/07/2012  Neck and his of injury fall onto pallets, no difficult Fall occurred at work.  Evaluated at [facility] 08/11/2012. CT of the abdomen and pelvis revealed left ninth anterior lateral rib fracture. He was given Percocet. Pain radiated to the back.  Followup at: [facility]. 09/04/2012. No change in pain. Examination showed no neurologic deficits.Was taking hydrocodone at time of injury.  Currently complaining of more widespread pain including pain in the neck and left upper extremity. Difficulty holding objects with both hands. Also some burning pain in left anterior thigh.  Treatment at occupational medicine was ibuprofen 800 mg 3 times a day and tramadol at night. He was placed on limited duty due to 5lb restriction  Pain diaphragm reviewed. Marks on left side of neck shoulder thoracic and lumbar spine as well as left elbow and left fingers. Also burning left anterior thigh.  PMH: ACDF with radiating R arm pain, 12 yrs ago Dr Mark Cortez, R rotator cuff injury, chronic hydrocodone for musculoskeletal pain   Facial numbness for a few hours also had arm and leg numbness which last while at work Worked 2.5 hours one day and then felt he couldn't work anymore Complaining of Left leg weakness,  Has been to PT 3 times, tried TENS and dry needling Did some exercise as well.   Pain Inventory Average Pain 10 Pain Right Now 10 My pain is constant, sharp, stabbing, tingling and aching  In the last 24 hours, has pain interfered with the following? General activity 10 Relation with others 1 Enjoyment of life 0 What TIME of day is your pain at its worst? all Sleep (in general) Poor  Pain is worse with: walking, bending, sitting and standing Pain improves with: medication Relief from Meds: 1  Mobility walk without assistance how many minutes  can you walk? 10-15 do you drive?  yes  Function employed # of hrs/week -was sent home for not being able to do the job with his pain I need assistance with the following:  household duties and shopping  Neuro/Psych weakness numbness tingling trouble walking spasms confusion depression anxiety  Prior Studies Any changes since last visit?  no  Physicians involved in your care Any changes since last visit?  no   Family History  Problem Relation Age of Onset  . Cancer Father     lung   History   Social History  . Marital Status: Married    Spouse Name: N/A    Number of Children: N/A  . Years of Education: N/A   Social History Main Topics  . Smoking status: Current Every Day Smoker -- 1.50 packs/day for 20 years    Types: Cigarettes  . Smokeless tobacco: Never Used  . Alcohol Use: No  . Drug Use: No  . Sexual Activity: None   Other Topics Concern  . None   Social History Narrative  . None   Past Surgical History  Procedure Laterality Date  . Appendectomy    . Cervical disc surgery    . Knee sugery    . Pleural scarification     Past Medical History  Diagnosis Date  . Asthma   . GERD (gastroesophageal reflux disease)   . Hemorrhoids   . Anal fissure   . COPD (chronic obstructive pulmonary disease)   . Cancer 2012  prostate- radiation treatments per pt.   BP 119/73  Pulse 75  Resp 16  Ht 5\' 10"  (1.778 m)  Wt 202 lb 8 oz (91.853 kg)  BMI 29.06 kg/m2  SpO2 99%    Review of Systems  Respiratory: Positive for cough, shortness of breath and wheezing.   Musculoskeletal: Positive for gait problem.       Spasms  Neurological: Positive for weakness and numbness.       Tingling  Psychiatric/Behavioral: Positive for confusion and dysphoric mood. The patient is nervous/anxious.   All other systems reviewed and are negative.       Objective:   Physical Exam  Nursing note and vitals reviewed. Constitutional: He appears well-developed and  well-nourished.  HENT:  Head: Normocephalic and atraumatic.  Eyes: Conjunctivae and EOM are normal. Pupils are equal, round, and reactive to light.  Neck: Normal range of motion.  Neurological:  Reflex Scores:      Tricep reflexes are 2+ on the right side and 2+ on the left side.      Bicep reflexes are 2+ on the right side and 2+ on the left side.      Brachioradialis reflexes are 2+ on the right side and 2+ on the left side.      Patellar reflexes are 2+ on the right side and 2+ on the left side.      Achilles reflexes are 2+ on the right side and 2+ on the left side. Decreased Left L4 Give away weakness bilateral ankle dorsiflexors with poor effort. Decreased left C6 and C7 sensation   Psychiatric: He has a normal mood and affect.          Assessment & Plan:  #1. Left rib contusion with Left 9th rib fx , no other interventions needed will trial lidoderm patch  #2. Left Neck pain and pain radiating to Left arm, normal sensation in bilateral UEs.  I reviewed the MRI the cervical spine no significant findings at C2-C3 C3-C4 mild disc bulges C4-C5. There is a small step off between C4 and C5. Radiologist report indicates uncovertebral spurring which makes sense. Dr. Danielle Cortez reviewed films. No need for neurosurgery follow up.Will start physcial therapy and return to work light duty.I reviewed worked description sent by his attorney. This does not look like a strenuous job.  Nevertheless pt states he only worked 2.5 hours before he stopped due to pain  The facial and arm numbness is not explained by a neck problem.  This can be pursed outside the [facility] system  with the pt's private MD or Neurologist  3. Left lateral thigh numbness. This is most likely secondary to a lateral femoral cutaneous nerve compression related to the fall. No complaints after starting gabapentin for this.  Hold off on return to full duty until testing is completed and reviewed at f/u visit  Return to clinic in 2 4.  Back pain with exam findings consistent with sacroiliac sprain, , now with give away weakness in LLE.Will eval with MRI lumbar spine.  If neg for compressive lesion will do SI jt injection   I have met with      nurse case manager with the patient present and we have discussed the plan of care for Patient    case manager Mal Misty RN and pt FAX [phone]  [address]  [place] [address]

## 2012-12-22 NOTE — Patient Instructions (Addendum)
No work in total reevaluation with MRI of the lumbar spine  Return to clinic 2 weeks  Continue physical therapy  I don't think the facial numbness episode combined with the left arm numbness is related to this injury. I recommend followup with private physician and possibly neurology.  Continue current medications refill for gabapentin and ibuprofen ordered  Hydrocodone prescribed by another MD for chronic pain from prior injury

## 2012-12-23 ENCOUNTER — Telehealth: Payer: Self-pay | Admitting: *Deleted

## 2012-12-23 NOTE — Telephone Encounter (Signed)
[facility] called and said they heard from the nurse case manager and they will not be doing the MRI.

## 2012-12-24 ENCOUNTER — Telehealth: Payer: Self-pay

## 2012-12-24 DIAGNOSIS — S336XXD Sprain of sacroiliac joint, subsequent encounter: Secondary | ICD-10-CM

## 2012-12-24 DIAGNOSIS — G5712 Meralgia paresthetica, left lower limb: Secondary | ICD-10-CM

## 2012-12-24 DIAGNOSIS — M5430 Sciatica, unspecified side: Secondary | ICD-10-CM

## 2012-12-24 DIAGNOSIS — M5432 Sciatica, left side: Secondary | ICD-10-CM

## 2012-12-24 NOTE — Telephone Encounter (Signed)
Order for Lumbar MRI placed for workers comp per last office note.

## 2012-12-25 ENCOUNTER — Ambulatory Visit: Payer: Worker's Compensation | Admitting: Physical Medicine & Rehabilitation

## 2013-01-05 ENCOUNTER — Encounter: Payer: Self-pay | Admitting: Physical Medicine & Rehabilitation

## 2013-01-05 ENCOUNTER — Ambulatory Visit (HOSPITAL_BASED_OUTPATIENT_CLINIC_OR_DEPARTMENT_OTHER): Payer: Worker's Compensation | Admitting: Physical Medicine & Rehabilitation

## 2013-01-05 ENCOUNTER — Encounter: Payer: Worker's Compensation | Attending: Physical Medicine & Rehabilitation

## 2013-01-05 VITALS — BP 134/59 | HR 83 | Resp 14 | Ht 70.0 in | Wt 205.6 lb

## 2013-01-05 DIAGNOSIS — M542 Cervicalgia: Secondary | ICD-10-CM | POA: Insufficient documentation

## 2013-01-05 DIAGNOSIS — Z5189 Encounter for other specified aftercare: Secondary | ICD-10-CM

## 2013-01-05 DIAGNOSIS — M79609 Pain in unspecified limb: Secondary | ICD-10-CM | POA: Insufficient documentation

## 2013-01-05 DIAGNOSIS — S2239XA Fracture of one rib, unspecified side, initial encounter for closed fracture: Secondary | ICD-10-CM | POA: Insufficient documentation

## 2013-01-05 DIAGNOSIS — W1809XA Striking against other object with subsequent fall, initial encounter: Secondary | ICD-10-CM | POA: Insufficient documentation

## 2013-01-05 DIAGNOSIS — M47816 Spondylosis without myelopathy or radiculopathy, lumbar region: Secondary | ICD-10-CM

## 2013-01-05 DIAGNOSIS — S336XXD Sprain of sacroiliac joint, subsequent encounter: Secondary | ICD-10-CM

## 2013-01-05 DIAGNOSIS — M47817 Spondylosis without myelopathy or radiculopathy, lumbosacral region: Secondary | ICD-10-CM

## 2013-01-05 DIAGNOSIS — Y990 Civilian activity done for income or pay: Secondary | ICD-10-CM | POA: Insufficient documentation

## 2013-01-05 MED ORDER — TRAZODONE HCL 50 MG PO TABS: 50.0000 mg | ORAL_TABLET | Freq: Every day | ORAL | Status: DC
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
## 2013-01-05 NOTE — Patient Instructions (Addendum)
May resume 2 hours per day 5 days a week for 1 week then increase to 4 hours per day 5 days per week.  Return to clinic in 3 weeks for sacroiliac injection  Continue current medications trazodone 50 mg at night Tramadol 50 mg 4 times per day Gabapentin 100 mg 3 times a day  Not at an MMI  Continue outpatient physical therapy

## 2013-02-03 ENCOUNTER — Ambulatory Visit (HOSPITAL_BASED_OUTPATIENT_CLINIC_OR_DEPARTMENT_OTHER): Payer: Worker's Compensation | Admitting: Physical Medicine & Rehabilitation

## 2013-02-03 ENCOUNTER — Encounter: Payer: Worker's Compensation | Attending: Physical Medicine & Rehabilitation

## 2013-02-03 ENCOUNTER — Encounter: Payer: Self-pay | Admitting: Physical Medicine & Rehabilitation

## 2013-02-03 VITALS — BP 127/73 | HR 78 | Resp 14 | Ht 70.0 in | Wt 205.0 lb

## 2013-02-03 DIAGNOSIS — Y990 Civilian activity done for income or pay: Secondary | ICD-10-CM | POA: Insufficient documentation

## 2013-02-03 DIAGNOSIS — S2239XA Fracture of one rib, unspecified side, initial encounter for closed fracture: Secondary | ICD-10-CM | POA: Insufficient documentation

## 2013-02-03 DIAGNOSIS — M79609 Pain in unspecified limb: Secondary | ICD-10-CM | POA: Insufficient documentation

## 2013-02-03 DIAGNOSIS — M542 Cervicalgia: Secondary | ICD-10-CM | POA: Insufficient documentation

## 2013-02-03 DIAGNOSIS — W1809XA Striking against other object with subsequent fall, initial encounter: Secondary | ICD-10-CM | POA: Insufficient documentation

## 2013-02-03 DIAGNOSIS — M533 Sacrococcygeal disorders, not elsewhere classified: Secondary | ICD-10-CM

## 2013-02-03 NOTE — Patient Instructions (Signed)
May resume light duty 4 hours per day until I see the patient back next in 3 weeks May resume work on 02/04/2013

## 2013-02-03 NOTE — Progress Notes (Signed)
  PROCEDURE RECORD [facility]   Name: PEGGY MONK DOB:[birth year] MRN: [id number]  Date:02/03/2013  Physician: Claudette Laws, MD    Nurse/CMA: Kelli Churn, CMA  Allergies:  Allergies  Allergen Reactions  . Penicillins     Consent Signed: yes  Is patient diabetic? no    Pregnant: no LMP: No LMP for male patient. (age 49-55)  Anticoagulants: no Anti-inflammatory: no Antibiotics: no  Procedure: Left sacroiliac injection  Position: Prone Start Time: 158  End Time: 203  Fluoro Time: 8  RN/CMA Jameica Couts, CMA Aiyanah Kalama, CMA    Time 128 206    BP 127/73 126/72    Pulse 78 114    Respirations 14 16    O2 Sat 100 100    S/S 6 6    Pain Level 8/10 6/10     D/C home with Preachers Wife Aurther Loft, patient A & O X 3, D/C instructions reviewed, and sits independently.

## 2013-02-03 NOTE — Progress Notes (Signed)
Left sacroiliac injection under fluoroscopic guidance  Indication: Left Low back and buttocks pain not relieved by medication management and other conservative care.  Informed consent was obtained after describing risks and benefits of the procedure with the patient, this includes bleeding, bruising, infection, paralysis and medication side effects. The patient wishes to proceed and has given written consent. The patient was placed in a prone position. The lumbar and sacral area was marked and prepped with Betadine. A 25-gauge 1-1/2 inch needle was inserted into the skin and subcutaneous tissue and 1 mL of 1% lidocaine was injected. Then a 25-gauge 3 inch spinal needle was inserted under fluoroscopic guidance into the left sacroiliac joint. AP and lateral images were utilized. Omnipaque 180x0.5 mL under live fluoroscopy demonstrated no intravascular uptake. Then a solution containing one ML of 40 mg per mL depomedrol and 2 ML of 1% lidocaine MPF was injected x1.5 mL. Patient tolerated the procedure well. Post procedure instructions were given. Please see post procedure form.   Pre injection pain 8/10 Post injection pain 6/10  F/u 3 weeks No PT needed no progress If <50% improvement after injection then rec FCE

## 2013-02-06 ENCOUNTER — Other Ambulatory Visit: Payer: Self-pay | Admitting: Physical Medicine & Rehabilitation

## 2013-03-02 ENCOUNTER — Encounter: Payer: Worker's Compensation | Attending: Physical Medicine & Rehabilitation

## 2013-03-02 ENCOUNTER — Encounter: Payer: Self-pay | Admitting: Physical Medicine & Rehabilitation

## 2013-03-02 ENCOUNTER — Ambulatory Visit (HOSPITAL_BASED_OUTPATIENT_CLINIC_OR_DEPARTMENT_OTHER): Payer: Worker's Compensation | Admitting: Physical Medicine & Rehabilitation

## 2013-03-02 VITALS — BP 122/81 | HR 102 | Resp 16 | Ht 70.0 in | Wt 203.0 lb

## 2013-03-02 DIAGNOSIS — R209 Unspecified disturbances of skin sensation: Secondary | ICD-10-CM | POA: Diagnosis not present

## 2013-03-02 DIAGNOSIS — S336XXD Sprain of sacroiliac joint, subsequent encounter: Secondary | ICD-10-CM

## 2013-03-02 DIAGNOSIS — M79609 Pain in unspecified limb: Secondary | ICD-10-CM | POA: Insufficient documentation

## 2013-03-02 DIAGNOSIS — M542 Cervicalgia: Secondary | ICD-10-CM | POA: Diagnosis not present

## 2013-03-02 DIAGNOSIS — S301XXA Contusion of abdominal wall, initial encounter: Secondary | ICD-10-CM | POA: Insufficient documentation

## 2013-03-02 DIAGNOSIS — Y990 Civilian activity done for income or pay: Secondary | ICD-10-CM | POA: Insufficient documentation

## 2013-03-02 DIAGNOSIS — Z5189 Encounter for other specified aftercare: Secondary | ICD-10-CM

## 2013-03-02 DIAGNOSIS — M549 Dorsalgia, unspecified: Secondary | ICD-10-CM | POA: Diagnosis present

## 2013-03-02 DIAGNOSIS — R296 Repeated falls: Secondary | ICD-10-CM | POA: Insufficient documentation

## 2013-03-02 MED ORDER — GABAPENTIN 100 MG PO CAPS: 100.0000 mg | ORAL_CAPSULE | Freq: Three times a day (TID) | ORAL | Status: DC

## 2013-03-02 NOTE — Patient Instructions (Addendum)
We will check EMG  left arm to evaluate numbness and coordination problem. 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

## 2013-03-02 NOTE — Progress Notes (Signed)
Subjective:    Patient ID: Mark Cortez, male    DOB: 1966-03-20, 47 y.o.   MRN: [id number]  HPI Date of injury 08/07/2012 Neck and his of injury fall onto pallets Fall occurred at work. Evaluated at [facility] 08/11/2012. CT of the abdomen and pelvis revealed left ninth anterior lateral rib fracture. He was given Percocet. Pain radiated to the back. Followup at: [facility]. 09/04/2012. No change in pain. Examination showed no neurologic deficits.Was taking hydrocodone at time of injury for another problem. Currently complaining of more widespread pain including pain in the neck and left upper extremity. Difficulty holding objects with Right hands.   Doing modified duty, using spray paint on pallets.  Improvement with left-sided low back pain after SI injection Main complaint is pain going down from the neck to the left hand. Previous cervical MRI showed no nerve compression. Has difficulty opening up spray paint cans.  Pt is left handed.  Some tingling in left fingers Pain Inventory Average Pain 4 Pain Right Now 4 My pain is aching  In the last 24 hours, has pain interfered with the following? General activity 0 Relation with others 0 Enjoyment of life 0 What TIME of day is your pain at its worst? constant Sleep (in general) Poor  Pain is worse with: bending and standing Pain improves with: injections Relief from Meds: 1  Mobility walk without assistance how many minutes can you walk? 10-15 do you drive?  yes  Function employed # of hrs/week . I need assistance with the following:  meal prep, household duties and shopping  Neuro/Psych weakness numbness tingling trouble walking spasms confusion depression anxiety  Prior Studies Any changes since last visit?  no  Physicians involved in your care Any changes since last visit?  no   Family History  Problem Relation Age of Onset  . Cancer Father     lung   History   Social  History  . Marital Status: Married    Spouse Name: N/A    Number of Children: N/A  . Years of Education: N/A   Social History Main Topics  . Smoking status: Current Every Day Smoker -- 1.50 packs/day for 20 years    Types: Cigarettes  . Smokeless tobacco: Never Used  . Alcohol Use: No  . Drug Use: No  . Sexual Activity: None   Other Topics Concern  . None   Social History Narrative  . None   Past Surgical History  Procedure Laterality Date  . Appendectomy    . Cervical disc surgery    . Knee sugery    . Pleural scarification     Past Medical History  Diagnosis Date  . Asthma   . GERD (gastroesophageal reflux disease)   . Hemorrhoids   . Anal fissure   . COPD (chronic obstructive pulmonary disease)   . Cancer 2012    prostate- radiation treatments per pt.   BP 122/81  Pulse 102  Resp 16  Ht 5\' 10"  (1.778 m)  Wt 203 lb (92.08 kg)  BMI 29.13 kg/m2  SpO2 98%     Review of Systems  Musculoskeletal: Positive for neck pain.  Neurological: Positive for weakness and numbness.  All other systems reviewed and are negative.       Objective:   Physical Exam  Nursing note and vitals reviewed. Constitutional: He is oriented to person, place, and time. He appears well-developed and well-nourished.  HENT:  Head: Normocephalic and atraumatic.  Eyes:  EOM are normal. Pupils are equal, round, and reactive to light.  Musculoskeletal:       Left wrist: He exhibits normal range of motion, no tenderness, no swelling and no effusion.       Cervical back: He exhibits decreased range of motion.       Right hand: He exhibits normal range of motion and no tenderness. Decreased sensation noted. Decreased sensation is present in the medial distribution. Decreased strength noted.  Neurological: He is alert and oriented to person, place, and time.  Psychiatric: He has a normal mood and affect.   - Tinels over wrists and elbows No evidence of hand atrophy       Assessment &  Plan:  #1. Left rib contusion with Left 9th rib fx , improved #2. Left Neck pain and pain radiating to Left arm, normal sensation in bilateral UEs.  I reviewed the MRI the cervical spine no significant findings at C2-C3 C3-C4 mild disc bulges C4-C5. There is a small step off between C4 and C5. Radiologist report indicates uncovertebral spurring which makes sense. Dr. Danielle Dess reviewed films. 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 Back pain with exam findings consistent with sacroiliac sprain,improved , I have met with nurse case manager with the patient present and we have discussed the plan of care for Patient  case manager Mal Misty RN and pt FAX [phone]  [address]  [place] [address]

## 2013-04-06 ENCOUNTER — Ambulatory Visit (HOSPITAL_BASED_OUTPATIENT_CLINIC_OR_DEPARTMENT_OTHER): Payer: Worker's Compensation | Admitting: Physical Medicine & Rehabilitation

## 2013-04-06 ENCOUNTER — Encounter: Payer: Self-pay | Admitting: Physical Medicine & Rehabilitation

## 2013-04-06 ENCOUNTER — Encounter: Payer: Worker's Compensation | Attending: Physical Medicine & Rehabilitation

## 2013-04-06 VITALS — BP 123/74 | HR 93 | Resp 14 | Ht 70.0 in | Wt 207.0 lb

## 2013-04-06 DIAGNOSIS — IMO0002 Reserved for concepts with insufficient information to code with codable children: Secondary | ICD-10-CM

## 2013-04-06 DIAGNOSIS — M542 Cervicalgia: Secondary | ICD-10-CM | POA: Insufficient documentation

## 2013-04-06 DIAGNOSIS — Y990 Civilian activity done for income or pay: Secondary | ICD-10-CM | POA: Insufficient documentation

## 2013-04-06 DIAGNOSIS — W1809XA Striking against other object with subsequent fall, initial encounter: Secondary | ICD-10-CM | POA: Insufficient documentation

## 2013-04-06 DIAGNOSIS — S336XXS Sprain of sacroiliac joint, sequela: Secondary | ICD-10-CM

## 2013-04-06 DIAGNOSIS — S2239XA Fracture of one rib, unspecified side, initial encounter for closed fracture: Secondary | ICD-10-CM | POA: Insufficient documentation

## 2013-04-06 DIAGNOSIS — M79609 Pain in unspecified limb: Secondary | ICD-10-CM | POA: Insufficient documentation

## 2013-04-06 DIAGNOSIS — M961 Postlaminectomy syndrome, not elsewhere classified: Secondary | ICD-10-CM | POA: Insufficient documentation
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
## 2013-04-06 NOTE — Patient Instructions (Addendum)
May return to driving without restrictions  Continue his chronic pain medications from his primary care physician  Maximal medical improvement no further testing needed.  No therapy needed.  2% PPD for Left sacroiliac sprain

## 2014-10-08 IMAGING — CT CT ABD-PELV W/ CM
2 of 5 series · 16 of 46 positions shown, 18 images · IV contrast (Omnipaque 300)
Comparison: None.

CLINICAL DATA: Fell.  Abdominal pain.

CT ABDOMEN AND PELVIS WITH CONTRAST
TECHNIQUE: Multidetector CT imaging of the abdomen and pelvis was
performed following the standard protocol during bolus
administration of intravenous contrast.
Contrast: 100mL OMNIPAQUE IOHEXOL 300 MG/ML  SOLN

[Series 2: abd_pel_with 5.0 b40f · axial · 0.75mm/px · z∈[-502,-86]mm · 13 of 95 slices shown, 15 images]
[im 6/95  soft-tissue]
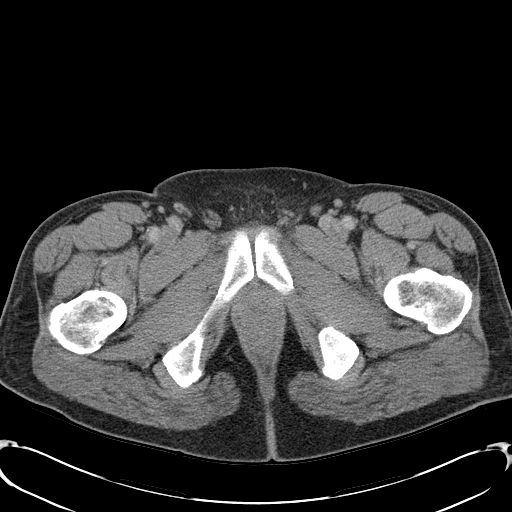
[im 6/95  bone]
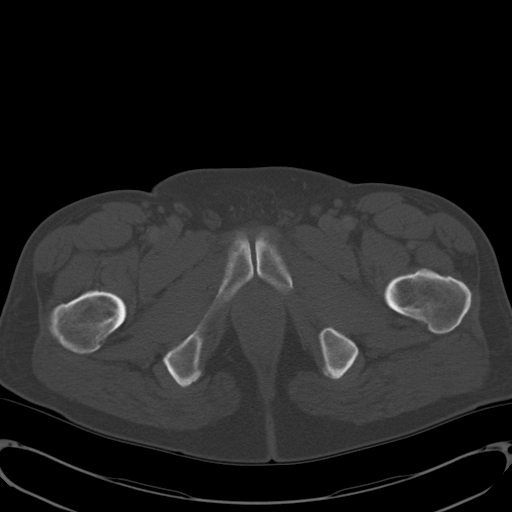
[im 11/95  soft-tissue]
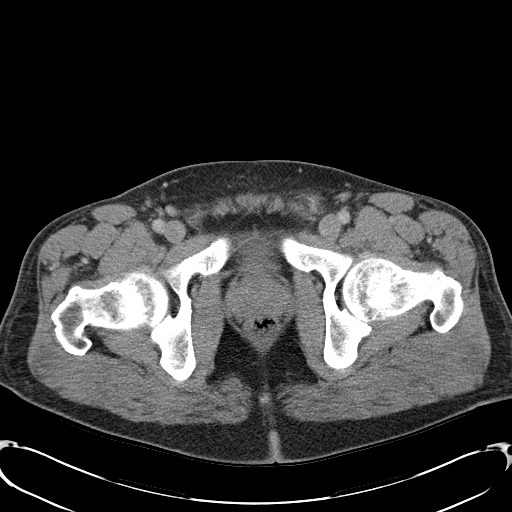
[im 21/95  soft-tissue]
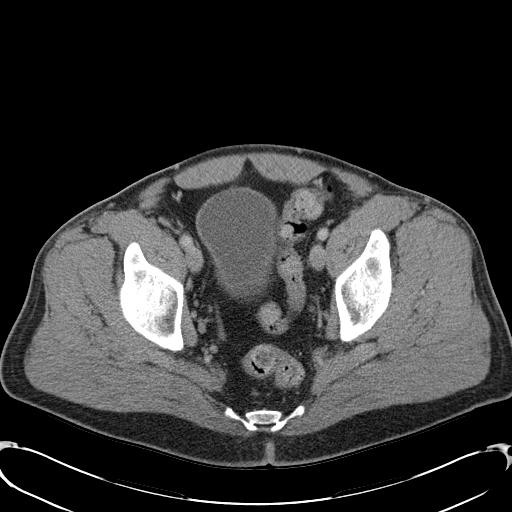
[im 27/95  soft-tissue]
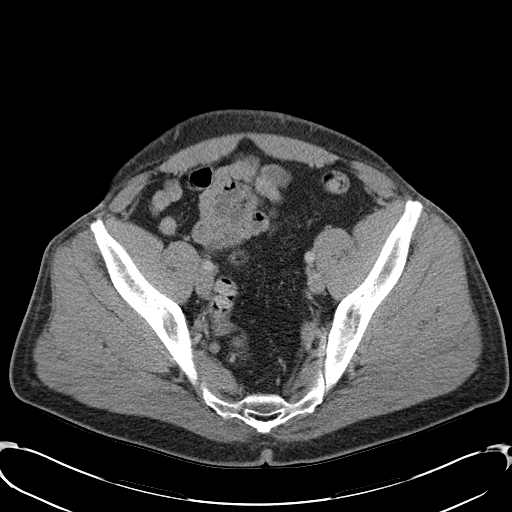
[im 32/95  soft-tissue]
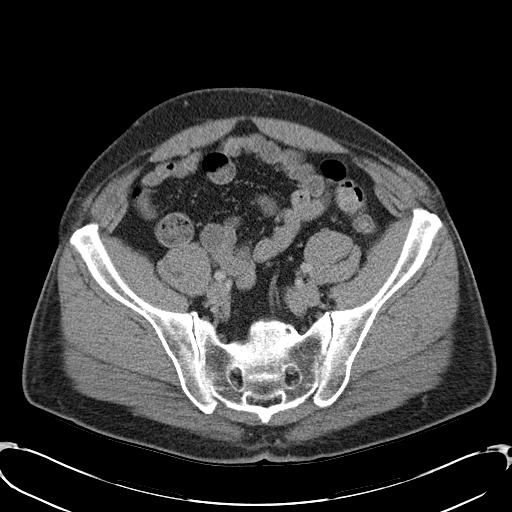
[im 42/95  soft-tissue]
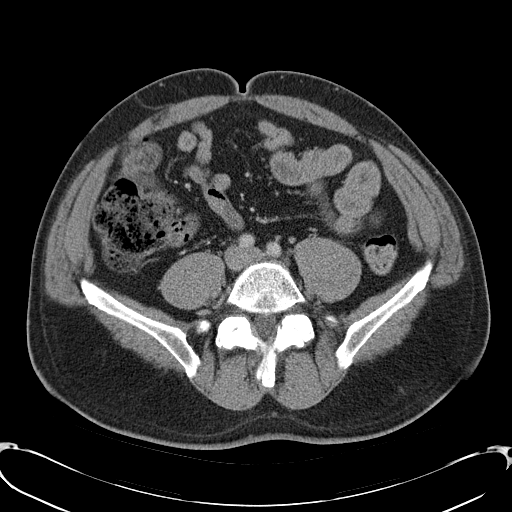
[im 48/95  soft-tissue]
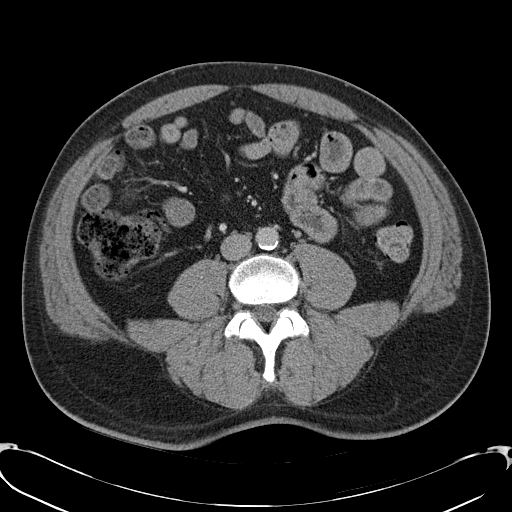
[im 53/95  soft-tissue]
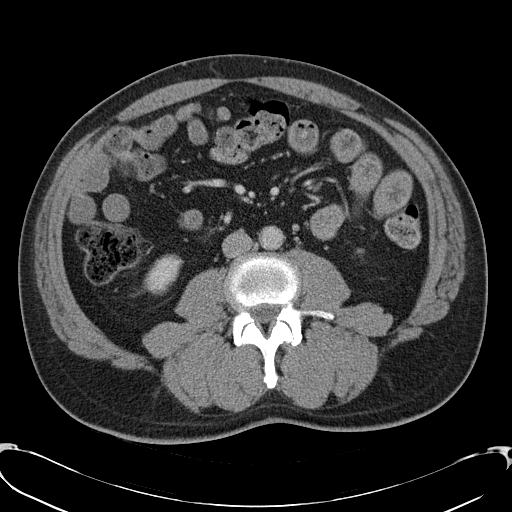
[im 63/95  soft-tissue]
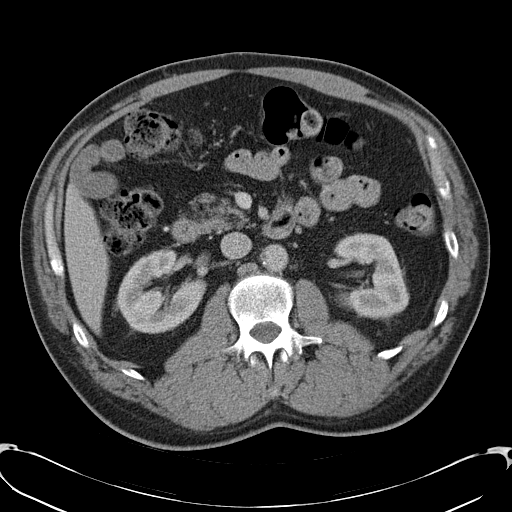
[im 63/95  bone]
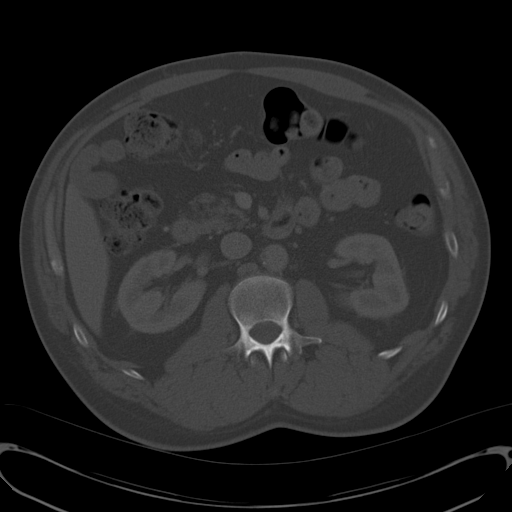
[im 68/95  soft-tissue]
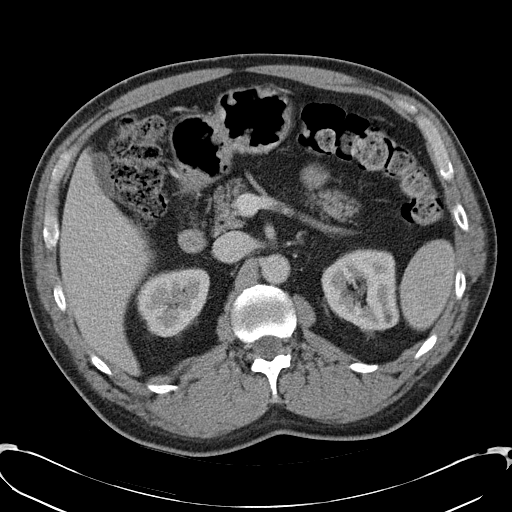
[im 74/95  soft-tissue]
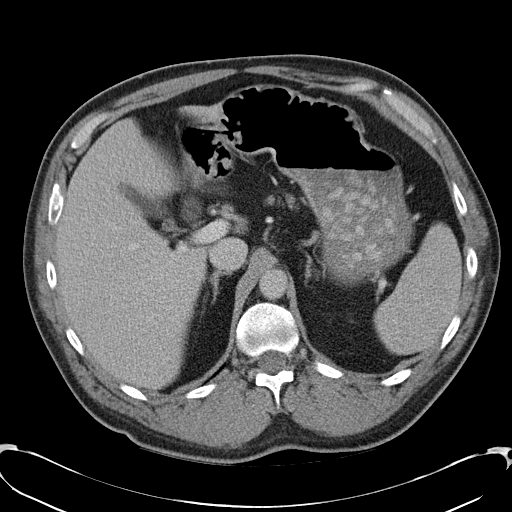
[im 84/95  soft-tissue]
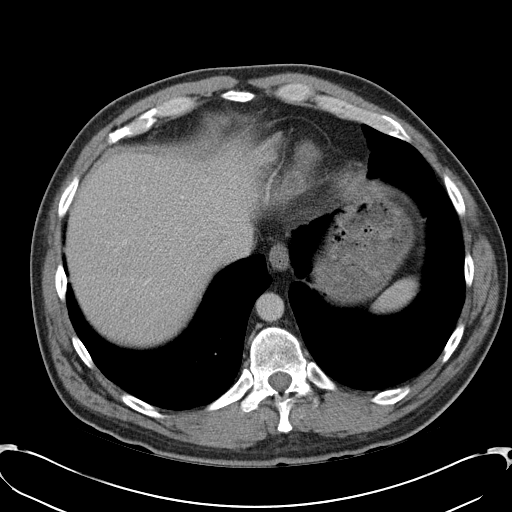
[im 89/95  soft-tissue]
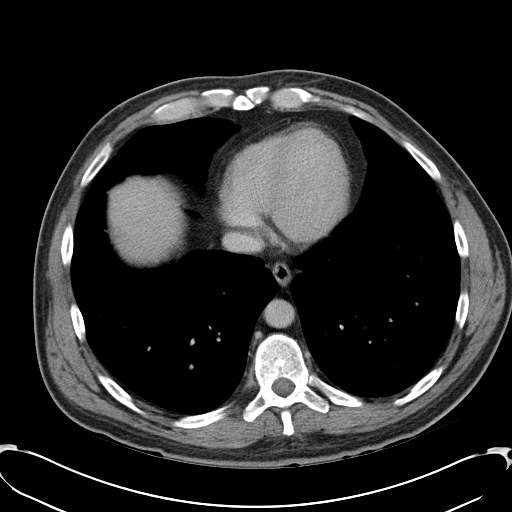

[Series 4: abd_pel_with 3.0 spo · coronal · 0.69mm/px · 3 of 90 slices shown]
[im 30/90  soft-tissue]
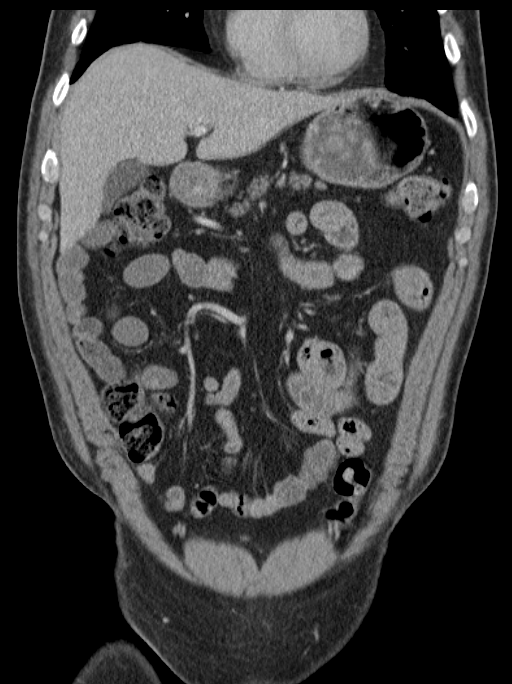
[im 40/90  soft-tissue]
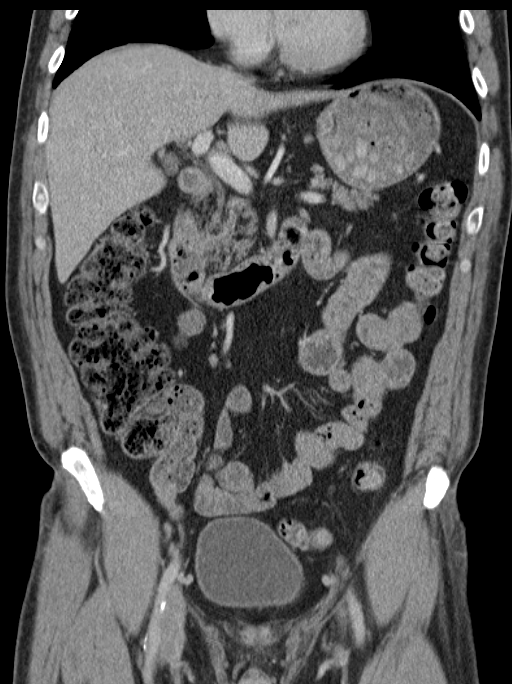
[im 50/90  soft-tissue]
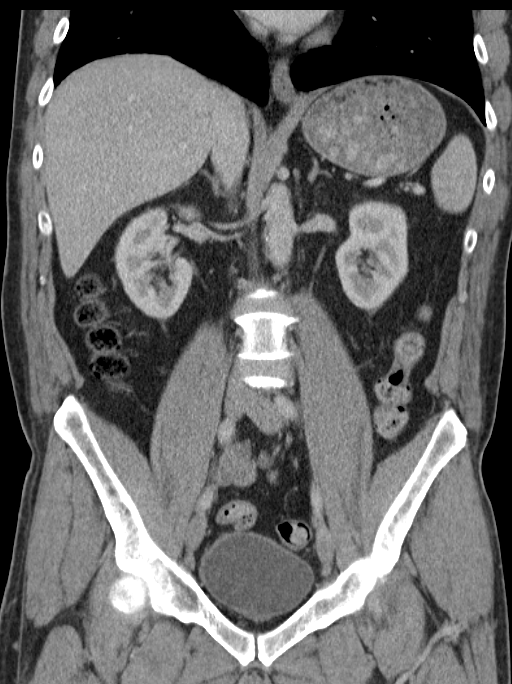

[16 of 46 positions shown; findings below may reference images not displayed]

FINDINGS: The lung bases are clear.  No pneumothorax, pulmonary
contusion or pleural effusion.  There is a nondisplaced fracture
involving the left ninth rib.

The solid abdominal organs are intact.  No acute injury or mass.
The gallbladder is normal.  No common bile duct dilatation.

The stomach, duodenum, small bowel and colon are grossly normal
without oral contrast.

The aorta is normal in caliber.  Minimal scattered atherosclerotic
calcifications.  No focal aneurysm or dissection.  The major branch
vessels are patent.  No mesenteric or retroperitoneal mass,
adenopathy or hematoma.

The bladder, prostate gland seminal vesicles are unremarkable.  No
pelvic mass, adenopathy or free pelvic fluid collections.  No
pelvic hematoma.  The bony pelvis is intact.  The lumbar vertebral
bodies are normally aligned.  No acute fracture.
IMPRESSION: 1.  Left 9th anterolateral rib fracture.
2.  No other significant bony findings.
3.  No acute abdominal/pelvic findings.
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
## 2015-08-20 ENCOUNTER — Emergency Department (HOSPITAL_COMMUNITY)
Admission: EM | Admit: 2015-08-20 | Discharge: 2015-08-21 | Disposition: A | Discharge status: Home / Self Care | Payer: No Typology Code available for payment source | Attending: Emergency Medicine | Admitting: Emergency Medicine

## 2015-08-20 ENCOUNTER — Encounter (HOSPITAL_COMMUNITY): Payer: Self-pay

## 2015-08-20 DIAGNOSIS — R040 Epistaxis: Secondary | ICD-10-CM | POA: Insufficient documentation

## 2015-08-20 DIAGNOSIS — J449 Chronic obstructive pulmonary disease, unspecified: Secondary | ICD-10-CM | POA: Insufficient documentation

## 2015-08-20 DIAGNOSIS — J45909 Unspecified asthma, uncomplicated: Secondary | ICD-10-CM | POA: Diagnosis not present

## 2015-08-20 DIAGNOSIS — Z79899 Other long term (current) drug therapy: Secondary | ICD-10-CM | POA: Diagnosis not present

## 2015-08-20 DIAGNOSIS — F17210 Nicotine dependence, cigarettes, uncomplicated: Secondary | ICD-10-CM | POA: Diagnosis not present

## 2015-08-20 MED ORDER — OXYMETAZOLINE HCL 0.05 % NA SOLN
2.0000 | Freq: Once | NASAL | Status: AC
  Administered 2015-08-20: 2 via NASAL
  Filled 2015-08-20: qty 15
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
## 2015-08-21 NOTE — Discharge Instructions (Signed)
Nosebleed Nosebleeds are common. A nosebleed can be caused by many things, including:  Getting hit hard in the nose.  Infections.  Dryness in your nose.  A dry climate.  Medicines.  Picking your nose.  Your home heating and cooling systems. HOME CARE   Try controlling your nosebleed by pinching your nostrils gently. Do this for at least 10 minutes.  Avoid blowing or sniffing your nose for a number of hours after having a nosebleed.  Do not put gauze inside of your nose yourself. If your nose was packed by your doctor, try to keep the pack inside of your nose until your doctor removes it.  If a gauze pack was used and it starts to fall out, gently replace it or cut off the end of it.  If a balloon catheter was used to pack your nose, do not cut or remove it unless told by your doctor.  Avoid lying down while you are having a nosebleed. Sit up and lean forward.  Use a nasal spray decongestant to help with a nosebleed as told by your doctor.  Do not use petroleum jelly or mineral oil in your nose. These can drip into your lungs.  Keep your house humid by using:  Less air conditioning.  A humidifier.  Aspirin and blood thinners make bleeding more likely. If you are prescribed these medicines and you have nosebleeds, ask your doctor if you should stop taking the medicines or adjust the dose. Do not stop medicines unless told by your doctor.  Resume your normal activities as you are able. Avoid straining, lifting, or bending at your waist for several days.  If your nosebleed was caused by dryness in your nose, use over-the-counter saline nasal spray or gel. If you must use a lubricant:  Choose one that is water-soluble.  Use it only as needed.  Do not use it within several hours of lying down.  Keep all follow-up visits as told by your doctor. This is important. GET HELP IF:  You have a fever.  You get frequent nosebleeds.  You are getting nosebleeds more  often. GET HELP RIGHT AWAY IF:  Your nosebleed lasts longer than 20 minutes.  Your nosebleed occurs after an injury to your face, and your nose looks crooked or broken.  You have unusual bleeding from other parts of your body.  You have unusual bruising on other parts of your body.  You feel light-headed or dizzy.  You become sweaty.  You throw up (vomit) blood.  You have a nosebleed after a head injury.   This information is not intended to replace advice given to you by your health care provider. Make sure you discuss any questions you have with your health care provider.   Document Released: 12/20/2007 Document Revised: 04/02/2014 Document Reviewed: 10/26/2013 Elsevier Interactive Patient Education [employer].
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
## 2015-11-15 ENCOUNTER — Emergency Department (HOSPITAL_COMMUNITY)
Admission: EM | Admit: 2015-11-15 | Discharge: 2015-11-15 | Discharge status: Absent without leave | Payer: No Typology Code available for payment source
